# Patient Record
Sex: Male | Born: 1990 | Race: White | Hispanic: No | Marital: Single | Smoking: Current every day smoker
Health system: Southern US, Community
[De-identification: ages and names within clinical notes are randomized; demographics above are authoritative.]

## PROBLEM LIST (undated history)

## (undated) HISTORY — PX: TONSILLECTOMY: SUR1361

---
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
## 2010-12-22 NOTE — Assessment & Plan Note (Signed)
Summary: np/mom sees Michael Gomez/wp   Vital Signs:  Patient Profile:   20 Years Old Male Height:     68 inches Weight:      213 pounds BMI:     32.50 Pulse rate:   78 / minute BP sitting:   123 / 77  Vitals Entered By: Lillia Pauls CMA (October 24, 2008 3:51 PM)                 PCP:  Helane Rima DO  Chief Complaint:  WCC and STD Testing.  History of Present Illness: Michael Gomez is an 20 year old presenting to meet his new physician and to discuss STD testing. He is the son of two of my patients, Dorinda Gomez and Sherman Donaldson.  1. High Risk/ STD Testing: 4 lifetime sexual partners, women only, uses condoms, denies dysuria, genital lesions,  urethral discharge, fevers/chills, weight loss  2. Tobacco: smokes 1/2 ppd Reds. He states that he is not willing to stop at this time.   3. Screen for HLD: Mother recently found to have highly elevated triglycerides; has Maternal Fam Hx of HLD, early CAD.    Prior Medication List:  No prior medications documented    Past Surgical History:    Tonsillectomy 2/2 Hypertrophy   Family History:    Father: COPD, HTN    Mother: Fibromyalgia, HLD, HTN    Maternal Family: Hx CAD <50  Social History:    Smokes 1/2 ppd Reds. Denies ETOH, Drugs. + Exercise: running and calisthenics. Refuses to wear seatbelt. Interested in [employer]. Active heterosexual; uses condoms. Attends [employer].   Risk Factors:  Tobacco use:  current    Cigarettes:  Yes    Counseled to quit/cut down tobacco use:  yes Passive smoke exposure:  no Drug use:  no HIV high-risk behavior:  yes    Comments:  Tested 10/24/08 NR Alcohol use:  no Exercise:  yes    Type:  Running, Calisthenics Seatbelt use:  0 %  Family History Risk Factors:    Family History of MI in females < 29 years old:  yes   Review of Systems  The patient denies fever, weight loss, weight gain, hoarseness, chest pain, syncope, dyspnea on exertion, peripheral edema, prolonged  cough, headaches, abdominal pain, melena, hematochezia, severe indigestion/heartburn, hematuria, genital sores, muscle weakness, suspicious skin lesions, and depression.     Physical Exam  General:     Well-developed,well-nourished,in no acute distress; alert,appropriate and cooperative throughout examination Head:     Normocephalic and atraumatic without obvious abnormalities. No apparent alopecia or balding. Eyes:     No corneal or conjunctival inflammation noted. EOMI. Perrla. Funduscopic exam benign, without hemorrhages, exudates or papilledema. Vision grossly normal. Ears:     External ear exam shows no significant lesions or deformities.  Otoscopic examination reveals clear canals, tympanic membranes are intact bilaterally without bulging, retraction, inflammation or discharge. Hearing is grossly normal bilaterally. Mouth:     Oral mucosa and oropharynx without lesions or exudates.  Teeth in good repair. Neck:     No deformities, masses, or tenderness noted. Lungs:     Normal respiratory effort, chest expands symmetrically. R wheezes and L wheezes.   Heart:     Normal rate and regular rhythm. S1 and S2 normal without gallop, murmur, click, rub or other extra sounds. Abdomen:     Bowel sounds positive,abdomen soft and non-tender without masses, organomegaly or hernias noted. Genitalia:     Patient refused. Msk:  No deformity or scoliosis noted of thoracic or lumbar spine.   Pulses:     R and L dorsalis pedis and posterior tibial pulses are full and equal bilaterally Neurologic:     No cranial nerve deficits noted. Station and gait are normal. DTRs are symmetrical throughout. Sensory, motor and coordinative functions appear intact. Skin:     Intact without suspicious lesions or rashes.    Impression & Recommendations:  Problem # 1:  WELL CHILD EXAMINATION (ICD-V20.2)  Orders: Hearing- [facility] (92551) Vision- [facility] [phone])   Problem # 2:  CONTACT OR EXPOSURE TO OTHER  VIRAL DISEASES (ICD-V01.79) Assessment: New Discussed safe sex practices. All labs negative.  Orders: GC/Chlamydia-[facility] (87591/87491) RPR-[facility] [phone]) HIV-[facility] ([id number])   Problem # 3:  SCREENING FOR LIPOID DISORDERS (ICD-V77.91) Assessment: New Mother with highly elevated trigylerides; Maternal Family Hx of early CAD. Patient is not fasting today, so will give order for future blood draw.  Future Orders: Lipid-[facility] ([id number]) ... 10/22/2009   Problem # 4:  TOBACCO USE (ICD-305.1) Assessment: New Educated benefits of smoking cessation and risks of continuing to smoke. He is not ready to quit at this time.  Problem # 5:  WHEEZING (ICD-786.07) Assessment: New Patient advised of wheezing heard on lung exam. He states that her has had no URI s/s. He has not noticed wheezing. Discussed smoking cessation. He is unwilling to quit at this time. He also refused albuterol treatment. Will follow.  Other Orders: Tdap => 86yrs IM (66063) Admin 1st Vaccine (01601)    ]  Tetanus/Td Vaccine    Vaccine Type: Tdap    Site: left deltoid    Mfr: Sanofi Pasteur    Dose: 0.5 ml    Route: IM    Given by: Lillia Pauls CMA    Exp. Date: 11/29/2010    Lot #: [id number]    VIS given: 10/10/07 version given October 24, 2008.   Appended Document: np/mom sees Michael Gomez/wp Add to A/P: 6. Obesity: discussed weight loss, healthy diet, marine expectations.

## 2010-12-22 NOTE — Letter (Signed)
Summary: Generic Letter  [facility]  [address]   [place], [place] [address]   Phone: [phone]  Fax: [phone]    10/30/2008  [address] [place], [place]  [address]  Dear Mr. Montemayor,  It was a pleasure to meet you the other day! Good news, the results of your lipid studies were all normal. If you have any questions, please don't hesitate to call.  Cholesterol    158 Triglycerides  84 LDL            105 HDL            36 *We like to see this number a little higher. It will                                       go up if you QUIT SMOKING!!!   Sincerely,   Helane Rima MD [facility]

## 2014-06-25 ENCOUNTER — Ambulatory Visit (INDEPENDENT_AMBULATORY_CARE_PROVIDER_SITE_OTHER): Payer: BC Managed Care – PPO | Admitting: Family Medicine

## 2014-06-25 ENCOUNTER — Ambulatory Visit (INDEPENDENT_AMBULATORY_CARE_PROVIDER_SITE_OTHER): Payer: BC Managed Care – PPO

## 2014-06-25 VITALS — BP 124/78 | HR 75 | Temp 99.2°F | Resp 17 | Ht 68.5 in | Wt 229.0 lb

## 2014-06-25 DIAGNOSIS — R112 Nausea with vomiting, unspecified: Secondary | ICD-10-CM

## 2014-06-25 DIAGNOSIS — K529 Noninfective gastroenteritis and colitis, unspecified: Secondary | ICD-10-CM

## 2014-06-25 DIAGNOSIS — K5289 Other specified noninfective gastroenteritis and colitis: Secondary | ICD-10-CM

## 2014-06-25 DIAGNOSIS — R1110 Vomiting, unspecified: Secondary | ICD-10-CM

## 2014-06-25 DIAGNOSIS — IMO0001 Reserved for inherently not codable concepts without codable children: Secondary | ICD-10-CM

## 2014-06-25 LAB — POCT CBC
Granulocyte percent: 74.6 %G (ref 37–80)
HCT, POC: 44.5 % (ref 43.5–53.7)
Hemoglobin: 14.6 g/dL (ref 14.1–18.1)
Lymph, poc: 1.7 (ref 0.6–3.4)
MCH, POC: 29.5 pg (ref 27–31.2)
MCHC: 32.9 g/dL (ref 31.8–35.4)
MCV: 89.9 fL (ref 80–97)
MID (cbc): 0.3 (ref 0–0.9)
MPV: 7 fL (ref 0–99.8)
POC Granulocyte: 5.8 (ref 2–6.9)
POC LYMPH PERCENT: 21.5 %L (ref 10–50)
POC MID %: 3.9 %M (ref 0–12)
Platelet Count, POC: 242 10*3/uL (ref 142–424)
RBC: 4.95 M/uL (ref 4.69–6.13)
RDW, POC: 14 %
WBC: 7.8 10*3/uL (ref 4.6–10.2)

## 2014-06-25 MED ORDER — METOCLOPRAMIDE HCL 10 MG PO TABS
10.0000 mg | ORAL_TABLET | Freq: Four times a day (QID) | ORAL | Status: DC | PRN
Start: 2014-06-25 — End: 2015-04-02

## 2014-06-25 MED ORDER — ONDANSETRON 8 MG PO TBDP: 8.0000 mg | ORAL_TABLET | Freq: Three times a day (TID) | ORAL | Status: DC | PRN

## 2014-06-25 NOTE — Patient Instructions (Signed)

## 2014-06-25 NOTE — Progress Notes (Signed)
Subjective:    Patient ID: Michael Gomez, male    DOB: 1991-08-13, 23 y.o.   MRN: [id number]  This chart was scribed for Dr. Norberto Sorenson, MD by Jarvis Morgan, ED Scribe. This patient was seen in Room 10 and the patient's care was started at 12:13 PM.  Chief Complaint  Patient presents with  . Nausea  . Emesis    HPI HPI Comments: Michael Gomez is a 23 y.o. male who presents to the [facility] complaining of moderate, episodic, emesis for 9 hours. Patient states that immediately after he eats or drinks anything he will have an episode of emesis. States that the only liquid he is able to keep down is water. He states he has not had a bowel movement today or any gas. States that a family friend has had the same symptoms recently but denies any recent contact with them. Unable to tolerate any solids. States that he does not feel sick other than the episodes of emesis. He denies any bile in his emesis. Denies any fever, abdominal pain, chills, diarrhea, nausea, lightheadedness, dizziness, or urine symptoms.    Patient Active Problem List   Diagnosis Date Noted  . TOBACCO USE 10/24/2008  . WHEEZING 10/24/2008   No past medical history on file. No past surgical history on file. No Known Allergies Prior to Admission medications   Not on File   History   Social History  . Marital Status: Single    Spouse Name: N/A    Number of Children: N/A  . Years of Education: N/A   Occupational History  . Not on file.   Social History Main Topics  . Smoking status: Current Every Day Smoker -- 0.50 packs/day for 8 years    Types: Cigarettes  . Smokeless tobacco: Not on file  . Alcohol Use: Not on file  . Drug Use: Not on file  . Sexual Activity: Not on file   Other Topics Concern  . Not on file   Social History Narrative  . No narrative on file      Review of Systems  Constitutional: Negative for fever and chills.  Gastrointestinal: Positive for vomiting  (episodic, non bilous ). Negative for nausea, abdominal pain and diarrhea.  Genitourinary: Negative for dysuria, hematuria, decreased urine volume and difficulty urinating.  Neurological: Negative for dizziness and light-headedness.       Objective:   Physical Exam  Nursing note and vitals reviewed. Constitutional: He is oriented to person, place, and time. He appears well-developed and well-nourished. No distress.  HENT:  Head: Normocephalic and atraumatic.  Eyes: Conjunctivae and EOM are normal.  Neck: Neck supple. No tracheal deviation present.  Cardiovascular: Normal rate, regular rhythm and normal heart sounds.  Exam reveals no gallop and no friction rub.   No murmur heard. Pulmonary/Chest: Effort normal and breath sounds normal. No respiratory distress. He has no decreased breath sounds. He has no wheezes. He has no rhonchi. He has no rales.  Abdominal: Soft. He exhibits no mass. There is no tenderness. There is no rebound and no guarding.  hypoactive tympanitic bowel sounds  Musculoskeletal: Normal range of motion.  Neurological: He is alert and oriented to person, place, and time.  Skin: Skin is warm and dry.  Psychiatric: He has a normal mood and affect. His behavior is normal.    BP 124/78  Pulse 75  Temp(Src) 99.2 F (37.3 C) (Oral)  Resp 17  Ht 5' 8.5" (1.74 m)  Wt 229 lb (103.874 kg)  BMI 34.31 kg/m2  SpO2 99%  Results for orders placed in visit on 06/25/14  POCT CBC      Result Value Ref Range   WBC 7.8  4.6 - 10.2 K/uL   Lymph, poc 1.7  0.6 - 3.4   POC LYMPH PERCENT 21.5  10 - 50 %L   MID (cbc) 0.3  0 - 0.9   POC MID % 3.9  0 - 12 %M   POC Granulocyte 5.8  2 - 6.9   Granulocyte percent 74.6  37 - 80 %G   RBC 4.95  4.69 - 6.13 M/uL   Hemoglobin 14.6  14.1 - 18.1 g/dL   HCT, POC 16.144.5  09.643.5 - 53.7 %   MCV 89.9  80 - 97 fL   MCH, POC 29.5  27 - 31.2 pg   MCHC 32.9  31.8 - 35.4 g/dL   RDW, POC 04.514.0     Platelet Count, POC 242  142 - 424 K/uL   MPV 7.0  0  - 99.8 fL    [facility] reading (PRIMARY) by  Dr. Clelia CroftShaw  Acute Abdomen Series- No acute abnormalities. Normal chest. Non specific bowel gas pattern.        Assessment & Plan:   Advised if not feeling better by Thursday, okay to call in for work note for extended time off Nausea and vomiting, vomiting of unspecified type - Plan: DG Abd Acute W/Chest, POCT CBC  Regurgitation - Plan: DG Abd Acute W/Chest, POCT CBC  Noninfectious gastroenteritis, unspecified  Meds ordered this encounter  Medications  . metoCLOPramide (REGLAN) 10 MG tablet    Sig: Take 1 tablet (10 mg total) by mouth every 6 (six) hours as needed for vomiting.    Dispense:  20 tablet    Refill:  0  . ondansetron (ZOFRAN ODT) 8 MG disintegrating tablet    Sig: Take 1 tablet (8 mg total) by mouth every 8 (eight) hours as needed for nausea or vomiting.    Dispense:  20 tablet    Refill:  0    I personally performed the services described in this documentation, which was scribed in my presence. The recorded information has been reviewed and considered, and addended by me as needed.  Norberto SorensonEva Shaw, MD MPH

## 2015-04-02 ENCOUNTER — Ambulatory Visit (INDEPENDENT_AMBULATORY_CARE_PROVIDER_SITE_OTHER): Payer: Worker's Compensation | Admitting: Family Medicine

## 2015-04-02 ENCOUNTER — Ambulatory Visit: Payer: Self-pay

## 2015-04-02 VITALS — BP 128/82 | HR 109 | Temp 97.4°F | Resp 16 | Ht 67.5 in | Wt 236.4 lb

## 2015-04-02 DIAGNOSIS — T2691XA Corrosion of right eye and adnexa, part unspecified, initial encounter: Secondary | ICD-10-CM

## 2015-04-02 DIAGNOSIS — S61411A Laceration without foreign body of right hand, initial encounter: Secondary | ICD-10-CM

## 2015-04-02 DIAGNOSIS — T2692XA Corrosion of left eye and adnexa, part unspecified, initial encounter: Secondary | ICD-10-CM | POA: Diagnosis not present

## 2015-04-02 DIAGNOSIS — T2690XA Corrosion of unspecified eye and adnexa, part unspecified, initial encounter: Secondary | ICD-10-CM

## 2015-04-02 NOTE — Progress Notes (Signed)
Chief Complaint:  Chief Complaint  Patient presents with  . Eye Problem    degreaser got into his eyes; states its burning  . small cut    on right hand/thumb from machine  . Numbness    right index/thumb    HPI: Michael Gomez is a 24 y.o. 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 He works for a Teaching laboratory technicianplumbing company. He was wearing his sunglasses however the hose knocked his glasses off and degreasing fluid  got into his eye. He also had the the jet from the jet hole sprayed his right hand and it has a pressure of about 1000 PSI. He got some cuts from the jet stream and also has numbness and tingling from it.   He does not have any sensation in his thumb or index finger except for the pads of the finger. There are some lacerations on it. He washed it out with hot water and soap. Denies any prior from a 2 his bones or musculature of his hands in the past. He has had cuts but nothing significant like this. No prior eye trauma.  They were Jetting floors and the hose came out of the floor drain and pressure from though fluid cut his right index and right thumb, and currently has numbness and tingling. Both eyes got degreaser in it. He washed them out immediately. He has no blurred vision, no drainage, no light sensitivity but has burning in the eyes bilaterally. He is UTD on tetanus.    History reviewed. No pertinent past medical history. Past Surgical History  Procedure Laterality Date  . Tonsillectomy     History   Social History  . Marital Status: Single    Spouse Name: N/A  . Number of Children: N/A  . Years of Education: N/A   Social History Main Topics  . Smoking status: Current Every Day Smoker -- 0.50 packs/day for 8 years    Types: Cigarettes  . Smokeless tobacco: Not on file  .  Alcohol Use: Yes  . Drug Use: No  . Sexual Activity: Not on file   Other Topics Concern  . None   Social History Narrative   History reviewed. No pertinent family history. No Known Allergies Prior to Admission medications   Not on File     ROS: The patient denies fevers, chills, night sweats, unintentional weight loss, chest pain, palpitations, wheezing, dyspnea on exertion, nausea, vomiting, abdominal pain, dysuria, hematuria, melena, numbness, weakness, or tingling.  All other systems have been reviewed and were otherwise negative with the exception of those mentioned in the HPI and as above.    PHYSICAL EXAM: Filed Vitals:   04/02/15 1335  BP: 128/82  Pulse: 109  Temp: 97.4 F (36.3 C)  Resp: 16   Filed Vitals:   04/02/15 1335  Height: 5' 7.5" (1.715 m)  Weight: 236 lb 6.4 oz (107.23 kg)   Body mass index is 36.46 kg/(m^2).  General: Alert, no acute distress HEENT:  Normocephalic, atraumatic, oropharynx patent. EOMI, PERRLA Funduscopic exam was within normal limits. He did not have any light sensitivity on exam. No obvious corneal abnormalities but we did not do fluroscein  exam. Cardiovascular:  Regular rate and rhythm, no rubs murmurs or gallops.  No  Carotid bruits, radial pulse intact. No pedal edema.  Respiratory: Clear to auscultation bilaterally.  No wheezes, rales, or rhonchi.  No cyanosis, no use of accessory musculature GI: No organomegaly, abdomen is soft and non-tender, positive bowel sounds.  No masses. Skin: Positive superficial lacerations of the index and thumb Neurologic: Facial musculature symmetric. Psychiatric: Patient is appropriate throughout our interaction. Lymphatic: No cervical lymphadenopathy Musculoskeletal: Gait intact. Decreased sensation in right index and right thumb thumb pad had normal sensation Poor exam due to limited range of motion secondary to pain. Her pulses intact, capillary refill was intact brisk +1 Minimal soft tissue  swelling + Superficial lacerations   LABS: Results for orders placed or performed in visit on 06/25/14  POCT CBC  Result Value Ref Range   WBC 7.8 4.6 - 10.2 K/uL   Lymph, poc 1.7 0.6 - 3.4   POC LYMPH PERCENT 21.5 10 - 50 %L   MID (cbc) 0.3 0 - 0.9   POC MID % 3.9 0 - 12 %M   POC Granulocyte 5.8 2 - 6.9   Granulocyte percent 74.6 37 - 80 %G   RBC 4.95 4.69 - 6.13 M/uL   Hemoglobin 14.6 14.1 - 18.1 g/dL   HCT, POC 16.144.5 09.643.5 - 53.7 %   MCV 89.9 80 - 97 fL   MCH, POC 29.5 27 - 31.2 pg   MCHC 32.9 31.8 - 35.4 g/dL   RDW, POC 04.514.0 %   Platelet Count, POC 242 142 - 424 K/uL   MPV 7.0 0 - 99.8 fL     EKG/XRAY:   [facility]. No obvious fracture or dislocation  But please comment on lucent line through distal proximal phalanx of 2nd finger   ASSESSMENT/PLAN: Encounter Diagnoses  Name Primary?  . Hand laceration, right, initial encounter Yes  . Chemical burn of eye or adnexa, unspecified laterality, initial encounter    Refer to ophthalmology stat, Dr. Laruth BouchardGroat's office was called and is expecting him as a work in Medical laboratory scientific officerBactroban on lacerations and follow-up in one day No wound closure at this time I'm worried about potential compartment syndrome due to the pressure that was involved, x-rays were unremarkable Patient was given precautions Advised to return in one day  Gross sideeffects, risk and benefits, and alternatives of medications d/w patient. Patient is aware that all medications have potential sideeffects and we are unable to predict every sideeffect or drug-drug interaction that may occur.  Hamilton CapriLE, Johncarlo Maalouf PHUONG, DO 04/02/2015 2:51 PM

## 2015-04-03 ENCOUNTER — Ambulatory Visit (INDEPENDENT_AMBULATORY_CARE_PROVIDER_SITE_OTHER): Payer: Worker's Compensation | Admitting: Family Medicine

## 2015-04-03 VITALS — BP 122/72 | HR 74 | Temp 98.3°F | Resp 16 | Ht 67.5 in | Wt 237.0 lb

## 2015-04-03 DIAGNOSIS — S61219D Laceration without foreign body of unspecified finger without damage to nail, subsequent encounter: Secondary | ICD-10-CM

## 2015-04-03 DIAGNOSIS — R202 Paresthesia of skin: Secondary | ICD-10-CM

## 2015-04-03 MED ORDER — MUPIROCIN 2 % EX OINT: TOPICAL_OINTMENT | CUTANEOUS | Status: AC

## 2015-04-03 NOTE — Progress Notes (Signed)
 Chief Complaint:  Chief Complaint  Patient presents with  . Follow-up    hand laceration     HPI: Antony ContrasDouglas E Ledger is a 24 y.o. male who is here for  recheck of his hand lacerations and also the numbness and tingling. The numbness and tingling has resolved. His hand has normal function. He still has superficial  abrasions on his hands he has been using the ointment. He went to the ophthalmologist for having to take greaser splash in  his eye, they did not see a chemical eye burn. The ophthalmologist did not see anything. He will follow-up with them accordingly.  History reviewed. No pertinent past medical history. Past Surgical History  Procedure Laterality Date  . Tonsillectomy     History   Social History  . Marital Status: Single    Spouse Name: N/A  . Number of Children: N/A  . Years of Education: N/A   Social History Main Topics  . Smoking status: Current Every Day Smoker -- 0.50 packs/day for 8 years    Types: Cigarettes  . Smokeless tobacco: Not on file  . Alcohol Use: Yes  . Drug Use: No  . Sexual Activity: Not on file   Other Topics Concern  . None   Social History Narrative   History reviewed. No pertinent family history. No Known Allergies Prior to Admission medications   Not on File     ROS: The patient denies fevers, chills, night sweats, unintentional weight loss, chest pain, palpitations, wheezing, dyspnea on exertion, nausea, vomiting, abdominal pain, dysuria, hematuria, melena, numbness, weakness, or tingling.   All other systems have been reviewed and were otherwise negative with the exception of those mentioned in the HPI and as above.    PHYSICAL EXAM: Filed Vitals:   04/03/15 1109  BP: 122/72  Pulse: 74  Temp: 98.3 F (36.8 C)  Resp: 16   Filed Vitals:   04/03/15 1109  Height: 5' 7.5" (1.715 m)  Weight: 237 lb (107.502 kg)   Body mass index is 36.55 kg/(m^2).  General: Alert, no acute distress HEENT:  Normocephalic,  atraumatic, oropharynx patent. EOMI, PERRLA Cardiovascular:  Regular rate and rhythm, no rubs murmurs or gallops.  No pedal edema.  Respiratory: Clear to auscultation bilaterally.  No wheezes, rales, or rhonchi.  No cyanosis, no use of accessory musculature GI: No organomegaly, abdomen is soft and non-tender, positive bowel sounds.  No masses. Skin: Hand wounds, superficial lacerations/abrasions Neurologic: Facial musculature symmetric. Psychiatric: Patient is appropriate throughout our interaction. Lymphatic: No cervical lymphadenopathy Musculoskeletal: Gait intact. 5 out of 5 strength, sensation intact, good radial pulse   LABS: Results for orders placed or performed in visit on 06/25/14  POCT CBC  Result Value Ref Range   WBC 7.8 4.6 - 10.2 K/uL   Lymph, poc 1.7 0.6 - 3.4   POC LYMPH PERCENT 21.5 10 - 50 %L   MID (cbc) 0.3 0 - 0.9   POC MID % 3.9 0 - 12 %M   POC Granulocyte 5.8 2 - 6.9   Granulocyte percent 74.6 37 - 80 %G   RBC 4.95 4.69 - 6.13 M/uL   Hemoglobin 14.6 14.1 - 18.1 g/dL   HCT, POC 40.944.5 81.143.5 - 53.7 %   MCV 89.9 80 - 97 fL   MCH, POC 29.5 27 - 31.2 pg   MCHC 32.9 31.8 - 35.4 g/dL   RDW, POC 91.414.0 %   Platelet Count, POC 242 142 - 424 K/uL  MPV 7.0 0 - 99.8 fL     EKG/XRAY:   [facility]   ASSESSMENT/PLAN: Encounter Diagnoses  Name Primary?  . Finger laceration, subsequent encounter Yes  . Right hand paresthesia    Pleasant 24 year old male who is here for recheck of a work comp injury on from a highly pressurized water hose that caused numbness and tingling and also lacerations.  His numbness and tingling have resolved. Wounds on hand are clean. Restrictions given for work. Follow-up in 4 days   Gross sideeffects, risk and benefits, and alternatives of medications d/w patient. Patient is aware that all medications have potential sideeffects and we are unable to predict every sideeffect or drug-drug interaction that may  occur.  ,  PHUONG, DO 04/03/2015 11:38 AM

## 2015-05-02 ENCOUNTER — Ambulatory Visit: Payer: Worker's Compensation

## 2015-05-02 ENCOUNTER — Ambulatory Visit: Payer: Self-pay

## 2015-05-02 ENCOUNTER — Ambulatory Visit (INDEPENDENT_AMBULATORY_CARE_PROVIDER_SITE_OTHER): Payer: Worker's Compensation | Admitting: Internal Medicine

## 2015-05-02 VITALS — BP 138/99 | HR 80 | Resp 16

## 2015-05-02 DIAGNOSIS — M25571 Pain in right ankle and joints of right foot: Secondary | ICD-10-CM | POA: Diagnosis not present

## 2015-05-02 DIAGNOSIS — S0101XA Laceration without foreign body of scalp, initial encounter: Secondary | ICD-10-CM

## 2015-05-02 DIAGNOSIS — M79671 Pain in right foot: Secondary | ICD-10-CM

## 2015-05-02 NOTE — Progress Notes (Signed)
MRN: [id number] DOB: [birth year]  Subjective:  Pt presents to clinic with an injury that occurred at work today.  He was on a ladder and he fell off onto a set of metal steps.  He thinks he landed on his bottom on the stairs but he is not completely sure because then the ladder landed on his head.  He did not have an LOC and feels fine in regards to his head.  He is having some stiffness and pain in his right knee and right foot/ankle area.  He is not sure if his foot caught in the ladder or twisted when he landed.  He is able to walk but he has pain.  Review of Systems  Musculoskeletal: Negative for gait problem and neck pain.  Skin: Positive for wound.  Neurological: Negative for headaches.    Objective:  BP 138/99 mmHg  Pulse 80  Resp 16  SpO2 98%  Physical Exam  Constitutional: He is oriented to person, place, and time and well-developed, well-nourished, and in no distress.  HENT:  Head: Normocephalic. Head laceration: 5cm laceration on the posterior scalp.    Right Ear: External ear normal.  Left Ear: External ear normal.  Eyes: Conjunctivae are normal.  Pulmonary/Chest: Effort normal.  Musculoskeletal:       Right knee: Normal. He exhibits no effusion and no ecchymosis. No tenderness found.       Right ankle: He exhibits normal range of motion (and no pain with resistance), no ecchymosis and no laceration. Tenderness. Lateral malleolus (mild TTP), AITFL (mild) and posterior TFL (mild) tenderness found. No medial malleolus, no head of 5th metatarsal and no proximal fibula tenderness found. Achilles tendon exhibits no pain.  Neurological: He is alert and oriented to person, place, and time.  Skin: Skin is warm and dry.  Psychiatric: Memory, affect and judgment normal.    Procedure:  Consent obtained.  Local anesthesia with 1% lido with epi. Once anesthesia is obtained the wound was cleaned and closed with 4-0 Prolene SI sutures for good closure placed.  [facility] reading (PRIMARY) by   Dr. Merla Riches.  Neg ankle and Neg foot. Assessment and Plan :  Foot pain, right - Plan: DG Foot Complete Right  Ankle pain, right - Plan: DG Ankle Complete Right  Scalp laceration, initial encounter - Plan: Tdap vaccine greater than or equal to 7yo IM   Expect patient will be sore tomorrow due to the fall. He will use OTC medications if that happens or he will contact me if he needs something stronger.  Wound care was d/w pt about his scalp wound.  He will RTC in 1 week to have the sutures removed.     Benny Lennert PA-C  [facility] 05/02/2015 4:18 PM I have participated in the care of this patient with the Advanced Practice Provider and agree with Diagnosis and Plan as documented. Robert P. Merla Riches, M.D.

## 2015-05-02 NOTE — Patient Instructions (Signed)

## 2016-01-17 IMAGING — CR DG ANKLE COMPLETE 3+V*R*
4 series · 4 of 4 positions shown · non-contrast
Comparison: None.

CLINICAL DATA: Ankle pain after falling today.  Initial encounter.

EXAM:
RIGHT ANKLE - COMPLETE 3+ VIEW

[AP]
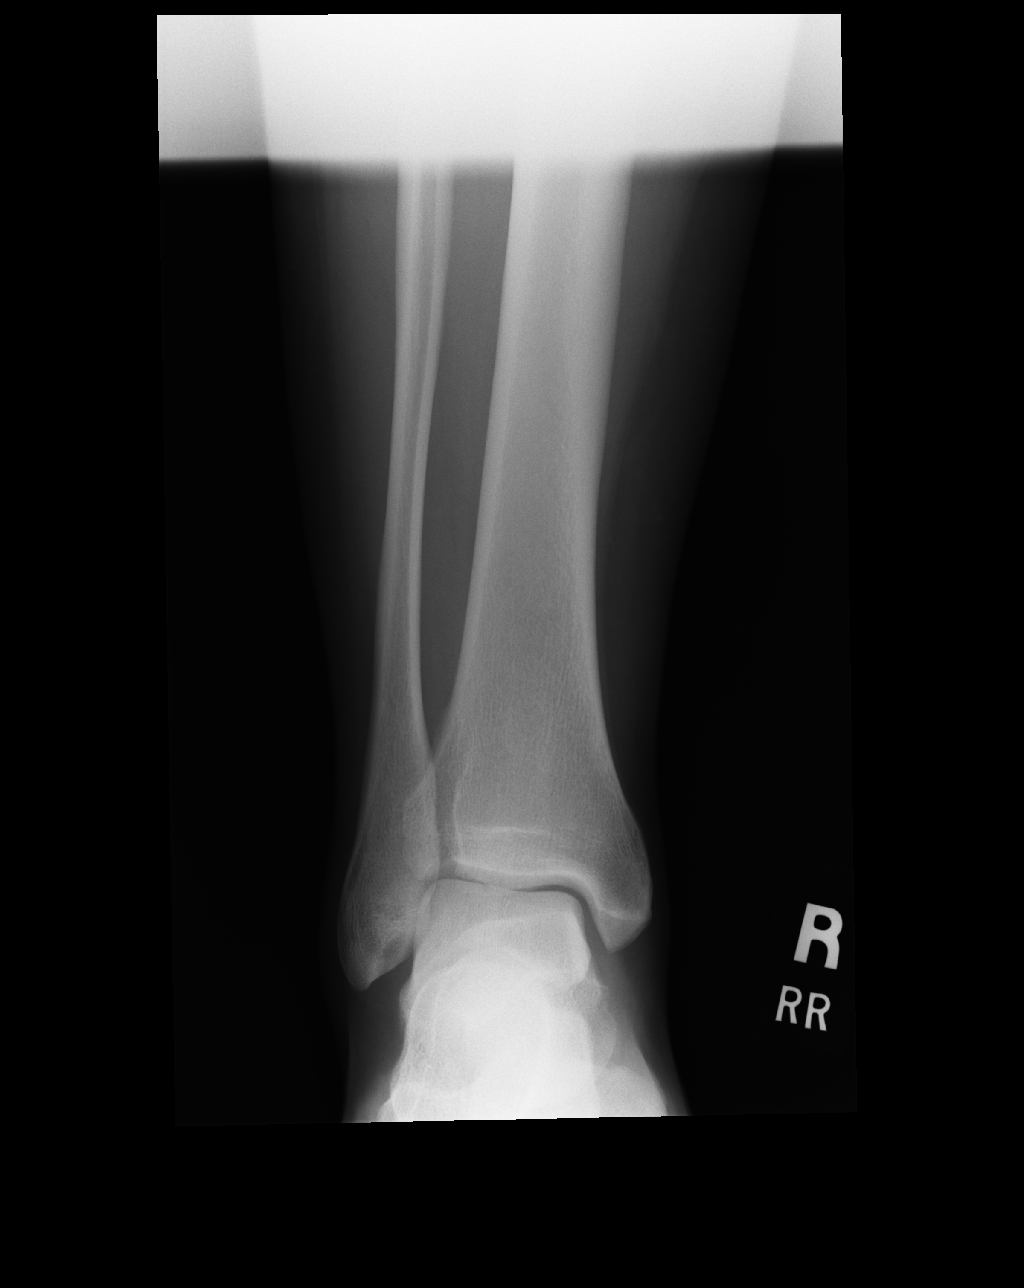

[ap obl int rot (1 of 2)]
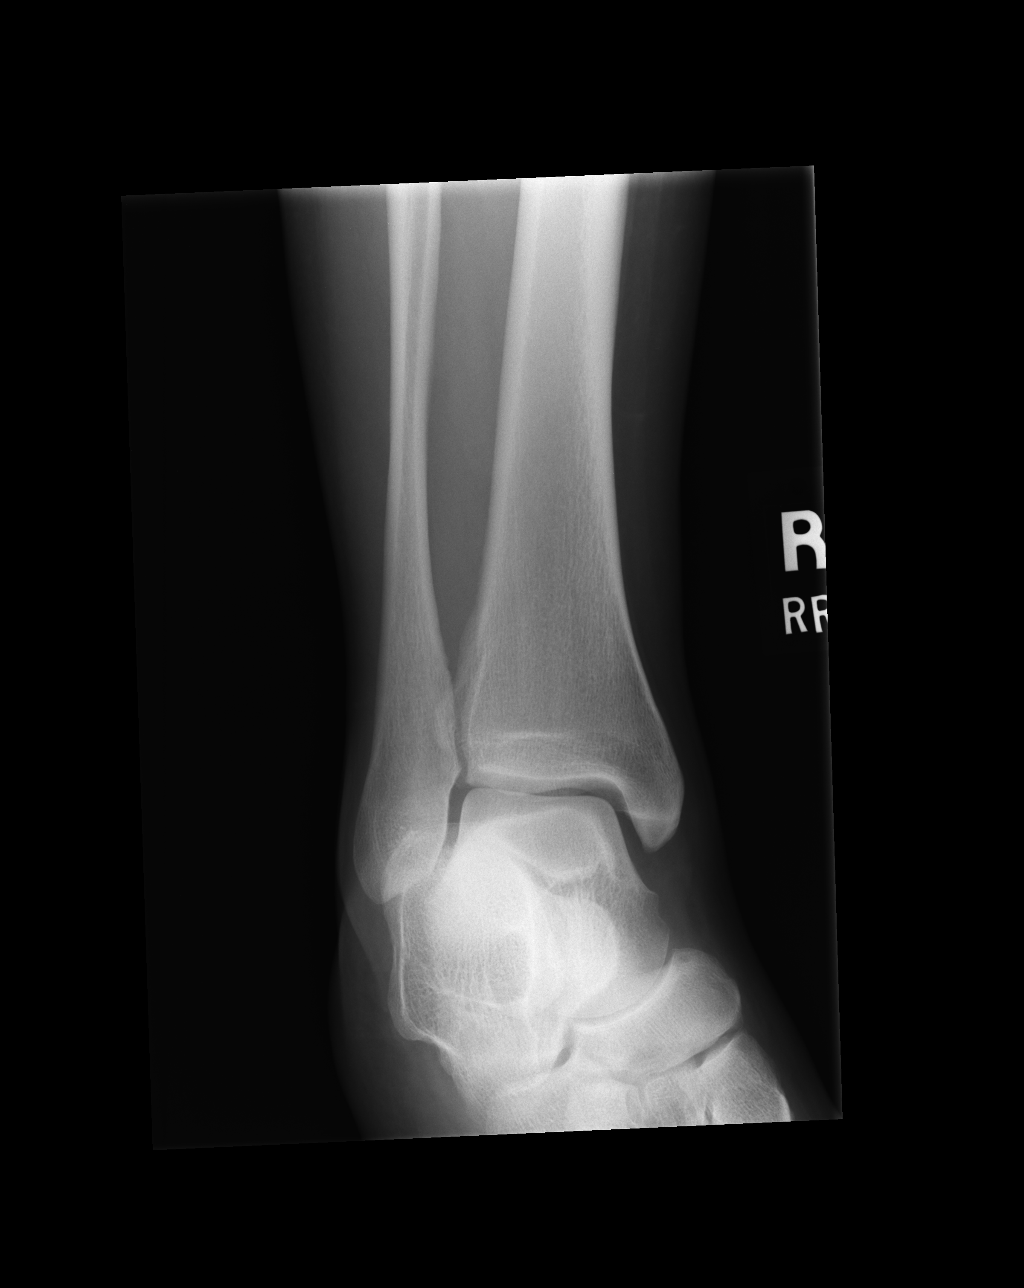

[ap obl int rot (2 of 2)]
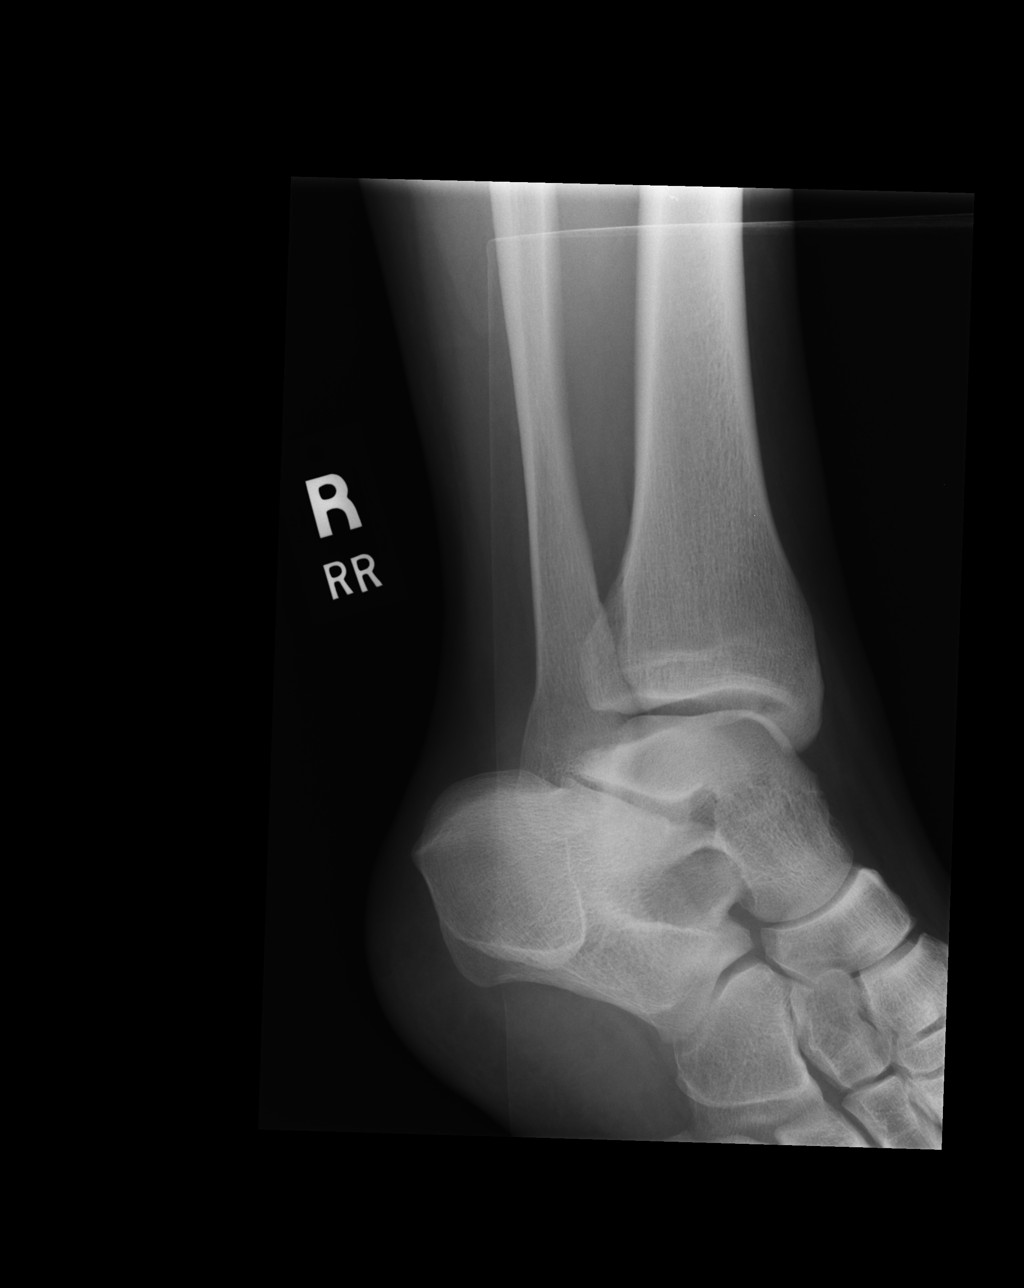

[lateral]
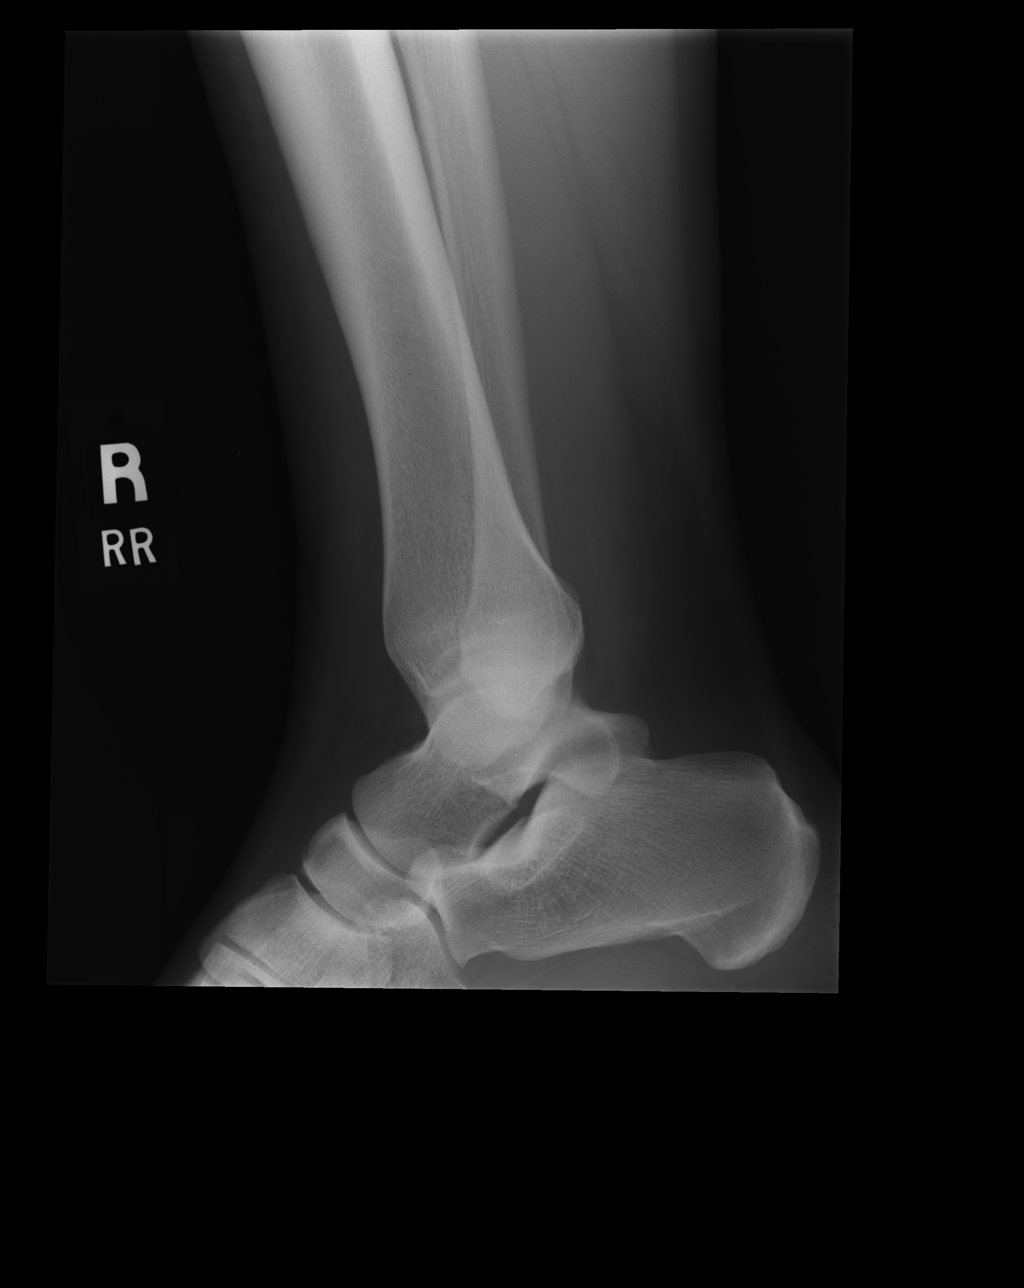

[4 of 4 positions shown; findings below may reference images not displayed]

FINDINGS: The mineralization and alignment are normal. There is no evidence of
acute fracture or dislocation. The joint spaces are maintained. No
focal soft tissue swelling or foreign body.
IMPRESSION: No acute osseous findings.

## 2017-10-12 ENCOUNTER — Ambulatory Visit: Payer: Self-pay | Admitting: Family Medicine
